# Patient Record
Sex: Female | Born: 1945 | Race: White | Hispanic: No | Smoking: Never smoker
Health system: Southern US, Community
[De-identification: ages and names within clinical notes are randomized; demographics above are authoritative.]

## PROBLEM LIST (undated history)

## (undated) DIAGNOSIS — I10 Essential (primary) hypertension: Secondary | ICD-10-CM

## (undated) HISTORY — PX: ESOPHAGEAL ATRESIA REPAIR: SHX1525

## (undated) HISTORY — PX: CHOLECYSTECTOMY: SHX55

---

## 2009-06-15 ENCOUNTER — Encounter: Admission: RE | Admit: 2009-06-15 | Discharge: 2009-08-05 | Payer: Self-pay | Admitting: Orthopedic Surgery

## 2009-08-05 ENCOUNTER — Encounter: Admission: RE | Admit: 2009-08-05 | Discharge: 2009-11-03 | Payer: Self-pay

## 2009-10-19 ENCOUNTER — Encounter: Admission: RE | Admit: 2009-10-19 | Discharge: 2010-01-17 | Payer: Self-pay | Admitting: Orthopedic Surgery

## 2009-11-16 ENCOUNTER — Encounter: Admission: RE | Admit: 2009-11-16 | Discharge: 2009-12-09 | Payer: Self-pay | Admitting: Orthopedic Surgery

## 2010-05-04 ENCOUNTER — Encounter: Admission: RE | Admit: 2010-05-04 | Discharge: 2010-06-17 | Payer: Self-pay | Admitting: Obstetrics and Gynecology

## 2013-01-11 ENCOUNTER — Emergency Department (HOSPITAL_BASED_OUTPATIENT_CLINIC_OR_DEPARTMENT_OTHER)
Admission: EM | Admit: 2013-01-11 | Discharge: 2013-01-11 | Disposition: A | Discharge status: Home / Self Care | Payer: Medicare Other | Attending: Emergency Medicine | Admitting: Emergency Medicine

## 2013-01-11 ENCOUNTER — Encounter (HOSPITAL_BASED_OUTPATIENT_CLINIC_OR_DEPARTMENT_OTHER): Payer: Self-pay | Admitting: *Deleted

## 2013-01-11 DIAGNOSIS — IMO0002 Reserved for concepts with insufficient information to code with codable children: Secondary | ICD-10-CM | POA: Insufficient documentation

## 2013-01-11 DIAGNOSIS — Y92009 Unspecified place in unspecified non-institutional (private) residence as the place of occurrence of the external cause: Secondary | ICD-10-CM | POA: Insufficient documentation

## 2013-01-11 DIAGNOSIS — Z791 Long term (current) use of non-steroidal anti-inflammatories (NSAID): Secondary | ICD-10-CM | POA: Insufficient documentation

## 2013-01-11 DIAGNOSIS — M129 Arthropathy, unspecified: Secondary | ICD-10-CM | POA: Insufficient documentation

## 2013-01-11 DIAGNOSIS — W64XXXA Exposure to other animate mechanical forces, initial encounter: Secondary | ICD-10-CM | POA: Insufficient documentation

## 2013-01-11 DIAGNOSIS — Y9389 Activity, other specified: Secondary | ICD-10-CM | POA: Insufficient documentation

## 2013-01-11 DIAGNOSIS — I10 Essential (primary) hypertension: Secondary | ICD-10-CM | POA: Insufficient documentation

## 2013-01-11 DIAGNOSIS — Z79899 Other long term (current) drug therapy: Secondary | ICD-10-CM | POA: Insufficient documentation

## 2013-01-11 DIAGNOSIS — Z880 Allergy status to penicillin: Secondary | ICD-10-CM | POA: Insufficient documentation

## 2013-01-11 DIAGNOSIS — S8010XA Contusion of unspecified lower leg, initial encounter: Secondary | ICD-10-CM | POA: Insufficient documentation

## 2013-01-11 HISTORY — DX: Essential (primary) hypertension: I10

## 2013-01-11 MED ORDER — CEPHALEXIN 500 MG PO CAPS: 500.0000 mg | ORAL_CAPSULE | Freq: Two times a day (BID) | ORAL | Status: AC

## 2013-01-11 NOTE — ED Provider Notes (Signed)
History  This chart was scribed for Jeanette Munch, MD by Jiles Prows, ED Scribe. The patient was seen in room MH04/MH04 and the patient's care was started at 10:09 PM.  CSN: [id number]  Arrival date & time 01/11/13  2048  Chief Complaint  Patient presents with  . Laceration    The history is provided by the patient and medical records. No language interpreter was used.   HPI Comments: Jeanette Sullivan is a 67 y.o. female with HTN and arthritis who presents to the Emergency Department complaining of sudden mild laceration to right leg from neighbor's pit bull this afternoon.  Pt reports dog jumped up at her, so she turned to the side to avoid a direct hit and came in contact with the dog at her left shoulder.  She reports that the dog hit her and bruised her up on the way down.  She reports that the dog did not bite her, but that the dog scratched her and broke the skin on her right lower leg/ ankle area. She reports that she cleaned the area with hydrogen peroxide and then treated the broken skin with neosporin.  Pt denies loss of sensation in foot or toes, headache, diaphoresis, fever, chills, nausea, vomiting, diarrhea, weakness, cough, SOB and any other pain.   Tetanus is UTD.  Past Medical History  Diagnosis Date  . Hypertension     Past Surgical History  Procedure Laterality Date  . Cholecystectomy    . Esophageal atresia repair      History reviewed. No pertinent family history.  History  Substance Use Topics  . Smoking status: Never Smoker   . Smokeless tobacco: Not on file  . Alcohol Use: Yes    OB History   Grav Para Term Preterm Abortions TAB SAB Ect Mult Living                  Review of Systems  Constitutional:       Per HPI, otherwise negative  HENT:       Per HPI, otherwise negative  Respiratory:       Per HPI, otherwise negative  Cardiovascular:       Per HPI, otherwise negative  Gastrointestinal: Negative for vomiting.  Endocrine:       Negative aside  from HPI  Genitourinary:       Neg aside from HPI   Musculoskeletal:       Per HPI, otherwise negative  Skin: Positive for wound.  Neurological: Negative for syncope.  A complete 10 system review of systems was obtained and all systems are negative except as noted in the HPI and PMH.    Allergies  Erythromycin; Penicillins; Sulfa antibiotics; and Tetracyclines & related  Home Medications   Current Outpatient Rx  Name  Route  Sig  Dispense  Refill  . busPIRone (BUSPAR) 15 MG tablet   Oral   Take 15 mg by mouth 3 (three) times daily.         . Diclofenac-Misoprostol (ARTHROTEC) 50-0.2 MG TBEC   Oral   Take by mouth.         . olmesartan-hydrochlorothiazide (BENICAR HCT) 40-25 MG per tablet   Oral   Take 1 tablet by mouth daily.         . Potassium Chloride CR (MICRO-K) 8 MEQ CPCR   Oral   Take 8 mEq by mouth.          BP 190/122  Pulse 70  Temp(Src) 98.3 F (36.8 C) (  Oral)  Resp 20  Ht 5\' 7"  (1.702 m)  Wt 150 lb (68.04 kg)  BMI 23.49 kg/m2  SpO2 100%  Physical Exam  Nursing note and vitals reviewed. Constitutional: She is oriented to person, place, and time. She appears well-developed and well-nourished. No distress.  HENT:  Head: Normocephalic and atraumatic.  Eyes: Conjunctivae and EOM are normal.  Cardiovascular: Normal rate, regular rhythm and intact distal pulses.   Pulmonary/Chest: No stridor. No respiratory distress.  Abdominal: She exhibits no distension.  Musculoskeletal: She exhibits no edema.       Right knee: Normal.       Right ankle: Normal.       Legs: Neurological: She is alert and oriented to person, place, and time. No cranial nerve deficit.  Neurovascularly intact.  Skin: Skin is warm and dry.  Hematoma 5 cm in diameter.  Skin is broken 1 cm in diameter.  Bleeding is under control.  Psychiatric: She has a normal mood and affect.    ED Course  Procedures (including critical care time) 10:16 PM - Discussed ED treatment with pt  at bedside including antibiotics, wound care, and following up with neighbor regarding rabies vaccination and pt agrees.   Labs Reviewed - No data to display No results found.   No diagnosis found.    MDM   I personally performed the services described in this documentation, which was scribed in my presence. The recorded information has been reviewed and is accurate.   Patient presents several hours after sustaining a scratch from a dog.  On exam she is awake and alert, in the description of a scribe for the bite, as well as the domestic nature of the dog is somewhat reassuring.  However, given the wound, the patient was advised that she has to ascertain the rabies status of the dog to safely exclude the possibility of rabies. Additionally, there is no current evidence of infection, and the fact that the wound is not full dermal depth is reassuring.  Given that the patient has significant drug allergies, she was given a preemptive description for antibiotic initiation should she develop any early signs of infection.  This was discussed at length. She was discharged in stable condition.  Tetanus is up to date.  Jeanette Munch, MD 01/11/13 2227

## 2013-01-11 NOTE — ED Notes (Signed)
Pt states she was "charged" by the neighbor's pit bull. Scratched on right lower ext. Bruising noted.

## 2013-01-11 NOTE — ED Notes (Signed)
Pt has about a 2cm ciricular abrasion RLL and 4cm x 6 cm bruise noted to LLE both in shin calve area

## 2021-12-24 ENCOUNTER — Other Ambulatory Visit: Payer: Self-pay

## 2021-12-24 ENCOUNTER — Encounter (HOSPITAL_BASED_OUTPATIENT_CLINIC_OR_DEPARTMENT_OTHER): Payer: Self-pay | Admitting: Emergency Medicine

## 2021-12-24 ENCOUNTER — Emergency Department (HOSPITAL_BASED_OUTPATIENT_CLINIC_OR_DEPARTMENT_OTHER): Payer: Medicare Other

## 2021-12-24 DIAGNOSIS — M79644 Pain in right finger(s): Secondary | ICD-10-CM | POA: Insufficient documentation

## 2021-12-24 NOTE — ED Triage Notes (Addendum)
R pinky finger pain and swelling. States it started hurting while gardening. She is not sure what happened. She is concerned something bit her.

## 2021-12-25 ENCOUNTER — Emergency Department (HOSPITAL_BASED_OUTPATIENT_CLINIC_OR_DEPARTMENT_OTHER)
Admission: EM | Admit: 2021-12-25 | Discharge: 2021-12-25 | Disposition: A | Discharge status: Home / Self Care | Payer: Medicare Other | Attending: Emergency Medicine | Admitting: Emergency Medicine

## 2021-12-25 DIAGNOSIS — M79644 Pain in right finger(s): Secondary | ICD-10-CM | POA: Diagnosis not present

## 2021-12-25 DIAGNOSIS — M1990 Unspecified osteoarthritis, unspecified site: Secondary | ICD-10-CM

## 2021-12-25 MED ORDER — LORATADINE 10 MG PO TABS
10.0000 mg | ORAL_TABLET | Freq: Once | ORAL | Status: AC
Start: 2021-12-25 — End: 2021-12-25
  Administered 2021-12-25: 10 mg via ORAL
  Filled 2021-12-25: qty 1

## 2021-12-25 MED ORDER — FAMOTIDINE 20 MG PO TABS
20.0000 mg | ORAL_TABLET | Freq: Once | ORAL | Status: AC
  Administered 2021-12-25: 20 mg via ORAL
  Filled 2021-12-25: qty 1

## 2021-12-25 MED ORDER — LIDOCAINE 4 % EX CREA
TOPICAL_CREAM | Freq: Once | CUTANEOUS | Status: AC
  Administered 2021-12-25: 1 via TOPICAL
  Filled 2021-12-25: qty 5

## 2021-12-25 MED ORDER — ACETAMINOPHEN 500 MG PO TABS
1000.0000 mg | ORAL_TABLET | Freq: Once | ORAL | Status: AC
  Administered 2021-12-25: 1000 mg via ORAL
  Filled 2021-12-25: qty 2

## 2021-12-25 NOTE — ED Provider Notes (Addendum)
[facility] Provider Note   CSN: [id number] Arrival date & time: 12/24/21  2056     History  Chief Complaint  Patient presents with   Hand Pain    Jeanette Sullivan is a 76 y.o. female.  The history is provided by the patient.  Hand Pain This is a new problem. The current episode started 12 to 24 hours ago. The problem occurs constantly. The problem has not changed since onset.Pertinent negatives include no chest pain, no abdominal pain, no headaches and no shortness of breath. Nothing aggravates the symptoms. Nothing relieves the symptoms. She has tried nothing for the symptoms. The treatment provided no relief.  Pain in her right small finger since gardening earlier in the day.  Does not     Home Medications Prior to Admission medications   Medication Sig Start Date End Date Taking? Authorizing Provider  busPIRone (BUSPAR) 15 MG tablet Take 15 mg by mouth 3 (three) times daily.    [provider]  cephALEXin (KEFLEX) 500 MG capsule Take 1 capsule (500 mg total) by mouth 2 (two) times daily. 01/11/13   Carmin Muskrat, MD  Diclofenac-Misoprostol (ARTHROTEC) 50-0.2 MG TBEC Take by mouth.    [provider]  olmesartan-hydrochlorothiazide (BENICAR HCT) 40-25 MG per tablet Take 1 tablet by mouth daily.    [provider]  Potassium Chloride CR (MICRO-K) 8 MEQ CPCR Take 8 mEq by mouth.    [provider]      Allergies    Erythromycin, Penicillins, Sulfa antibiotics, and Tetracyclines & related    Review of Systems   Review of Systems  Respiratory:  Negative for shortness of breath.   Cardiovascular:  Negative for chest pain.  Gastrointestinal:  Negative for abdominal pain.  Musculoskeletal:  Positive for arthralgias.  Skin:  Negative for color change and wound.  Neurological:  Negative for headaches.   Physical Exam Updated Vital Signs BP (!) 183/97 (BP Location: Left Arm)   Pulse 68   Temp 98.9 F (37.2 C)  (Oral)   Resp 16   Ht 5\' 6"  (1.676 m)   Wt 68 kg   SpO2 100%   BMI 24.21 kg/m  Physical Exam Vitals and nursing note reviewed.  Constitutional:      General: She is not in acute distress.    Appearance: Normal appearance.  HENT:     Head: Normocephalic and atraumatic.     Nose: Nose normal.  Eyes:     Pupils: Pupils are equal, round, and reactive to light.  Cardiovascular:     Rate and Rhythm: Normal rate and regular rhythm.     Pulses: Normal pulses.     Heart sounds: Normal heart sounds.  Pulmonary:     Effort: Pulmonary effort is normal.     Breath sounds: Normal breath sounds.  Abdominal:     General: Bowel sounds are normal.     Palpations: Abdomen is soft.     Tenderness: There is no abdominal tenderness. There is no guarding.  Musculoskeletal:        General: Normal range of motion.     Right wrist: Normal.     Right hand: Normal. No swelling, deformity, lacerations or bony tenderness. Normal range of motion. Normal strength. Normal sensation. There is no disruption of two-point discrimination. Normal capillary refill. Normal pulse.     Cervical back: Normal range of motion and neck supple.     Comments: No wounds  Skin:    General: Skin  is warm and dry.     Capillary Refill: Capillary refill takes less than 2 seconds.     Findings: No erythema or rash.  Neurological:     General: No focal deficit present.     Mental Status: She is alert and oriented to person, place, and time.     Deep Tendon Reflexes: Reflexes normal.  Psychiatric:        Mood and Affect: Mood normal.        Behavior: Behavior normal.    ED Results / Procedures / Treatments   Labs (all labs ordered are listed, but only abnormal results are displayed) Labs Reviewed - No data to display  EKG None  Radiology DG Finger Little Right  Result Date: 12/24/2021 CLINICAL DATA:  Right little finger pain and swelling EXAM: RIGHT LITTLE FINGER 2+V COMPARISON:  None Available. FINDINGS: Moderate  osteoarthrosis of the right little finger greatest at the proximal interphalangeal joint. Periarticular soft tissue swelling. 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 Electronically Signed   By: Ulyses Jarred M.D.   On: 12/24/2021 22:03    Procedures Procedures    Medications Ordered in ED Medications  acetaminophen (TYLENOL) tablet 1,000 mg (1,000 mg Oral Given 12/25/21 0225)  loratadine (CLARITIN) tablet 10 mg (10 mg Oral Given 12/25/21 0225)  famotidine (PEPCID) tablet 20 mg (20 mg Oral Given 12/25/21 0225)    ED Course/ Medical Decision Making/ A&P                           Medical Decision Making Was gardening and developed pain in PIP joint of Right 5th finger.    Amount and/or Complexity of Data Reviewed External Data Reviewed: notes.    Details: previous notes reviewed. Radiology: ordered and independent interpretation performed.    Details: no broken bones no soft tissue swelling  Risk OTC drugs. Risk Details: The pain is where the arthritis is the worst.  There is no redness nor warmth.  No signs of envenomation.  No [place] signs.  I have treated the patient in the ED>  I suspect this is just overuse of the digit which has severe arthritis.  Tylenol Ibuprofen and ice.      Final Clinical Impression(s) / ED Diagnoses Final diagnoses:  None  Return for intractable cough, coughing up blood, fevers > 100.4 unrelieved by medication, shortness of breath, intractable vomiting, chest pain, shortness of breath, weakness, numbness, changes in speech, facial asymmetry, abdominal pain, passing out, Inability to tolerate liquids or food, cough, altered mental status or any concerns. No signs of systemic illness or infection. The patient is nontoxic-appearing on exam and vital signs are within normal limits.  I have reviewed the triage vital signs and the nursing notes. Pertinent labs & imaging results that were  available during my care of the patient were reviewed by me and considered in my medical decision making (see chart for details). After history, exam, and medical workup I feel the patient has been appropriately medically screened and is safe for discharge home. Pertinent diagnoses were discussed with the patient. Patient was given return precautions.       Dwana Garin, MD 12/25/21 [id number]

## 2021-12-25 NOTE — Discharge Instructions (Signed)
Alternate tylenol and ibuprofen 

## 2022-07-13 IMAGING — CR DG FINGER LITTLE 2+V*R*
3 series · 3 of 3 positions shown · non-contrast
Comparison: None Available.

CLINICAL DATA: Right little finger pain and swelling

EXAM:
RIGHT LITTLE FINGER 2+V

[x finger pa right]
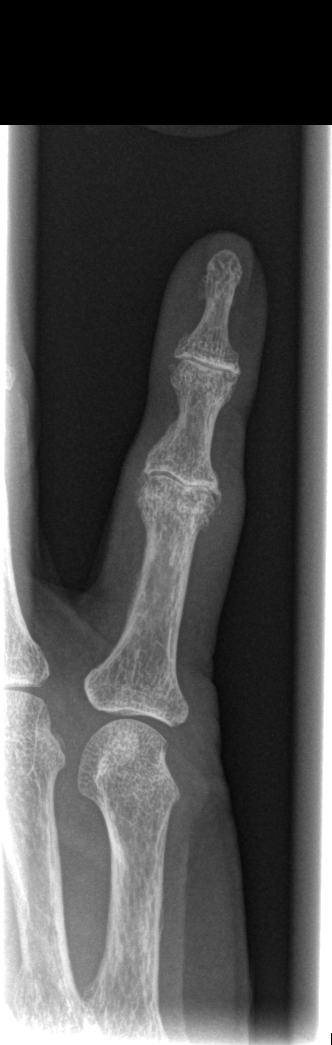

[x finger obl. right]
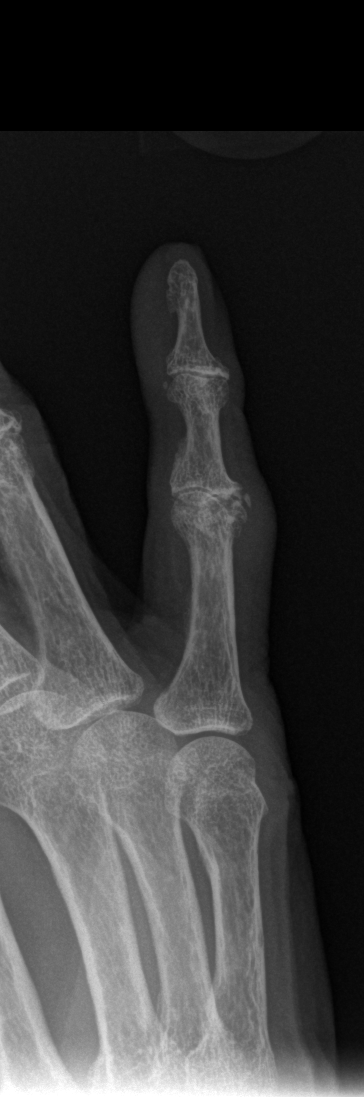

[x finger lateral right]
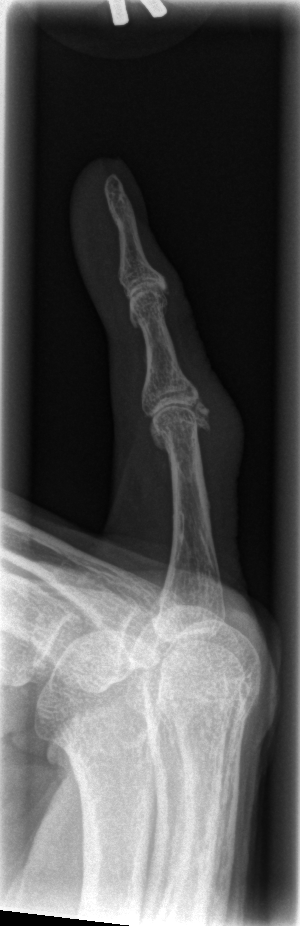

[3 of 3 positions shown; findings below may reference images not displayed]

FINDINGS: Moderate osteoarthrosis of the right little finger greatest at the
proximal interphalangeal joint. Periarticular soft tissue swelling.
IMPRESSION: Moderate osteoarthrosis of the right little finger greatest at the
proximal interphalangeal joint.
